# Patient Record
Sex: Male | Born: 1970 | Race: White | Hispanic: No | Marital: Married | Smoking: Former smoker
Health system: Southern US, Community
[De-identification: ages and names within clinical notes are randomized; demographics above are authoritative.]

## PROBLEM LIST (undated history)

## (undated) DIAGNOSIS — T7840XA Allergy, unspecified, initial encounter: Secondary | ICD-10-CM

## (undated) DIAGNOSIS — G4730 Sleep apnea, unspecified: Secondary | ICD-10-CM

## (undated) DIAGNOSIS — M795 Residual foreign body in soft tissue: Secondary | ICD-10-CM

## (undated) DIAGNOSIS — E119 Type 2 diabetes mellitus without complications: Secondary | ICD-10-CM

## (undated) HISTORY — DX: Residual foreign body in soft tissue: M79.5

## (undated) HISTORY — PX: SHOULDER SURGERY: SHX246

---

## 2013-10-21 DIAGNOSIS — M795 Residual foreign body in soft tissue: Secondary | ICD-10-CM

## 2013-10-21 HISTORY — DX: Residual foreign body in soft tissue: M79.5

## 2014-04-04 ENCOUNTER — Other Ambulatory Visit: Payer: Self-pay | Admitting: Occupational Medicine

## 2014-04-04 ENCOUNTER — Ambulatory Visit: Payer: Self-pay

## 2014-04-04 DIAGNOSIS — R52 Pain, unspecified: Secondary | ICD-10-CM

## 2014-04-05 ENCOUNTER — Other Ambulatory Visit: Payer: Self-pay | Admitting: Occupational Medicine

## 2014-04-05 ENCOUNTER — Ambulatory Visit: Payer: Self-pay

## 2014-04-05 DIAGNOSIS — R52 Pain, unspecified: Secondary | ICD-10-CM

## 2014-04-07 ENCOUNTER — Ambulatory Visit (INDEPENDENT_AMBULATORY_CARE_PROVIDER_SITE_OTHER): Payer: Worker's Compensation | Admitting: General Surgery

## 2014-04-07 ENCOUNTER — Encounter (INDEPENDENT_AMBULATORY_CARE_PROVIDER_SITE_OTHER): Payer: Self-pay | Admitting: General Surgery

## 2014-04-07 VITALS — BP 130/82 | HR 66 | Temp 97.4°F | Resp 16 | Ht 70.0 in | Wt 251.4 lb

## 2014-04-07 DIAGNOSIS — T148XXA Other injury of unspecified body region, initial encounter: Secondary | ICD-10-CM

## 2014-04-07 DIAGNOSIS — M795 Residual foreign body in soft tissue: Secondary | ICD-10-CM

## 2014-04-07 NOTE — Progress Notes (Signed)
Patient ID: Victor Wiley, male   DOB: [birth year], 43 y.o.   MRN: [id number]  No chief complaint on file.   HPI Victor Wiley is a 43 y.o. male.  The patient is a 43 year old male who is referred by Dr. Halford ChessmanM. Hunt secondary to a retained metal foreign body. The patient states he was having some equipment with a metal hammer and chisel when a chip falloff and into the subcutaneous tissue his abdomen the area began bleeding hematoma. The patient was seen [facility] and underwent a x-ray which revealed a metal foreign body. Patient tetanus shot was up-to-date at that time. The patient in today with tenderness to the area of the foreign body. He was started on antibiotics.  HPI  Past Medical History  Diagnosis Date  . Residual foreign body in soft tissue 2015    Past Surgical History  Procedure Laterality Date  . Shoulder surgery  207    No family history on file.  Social History History  Substance Use Topics  . Smoking status: Former Smoker    Quit date: 04/07/2012  . Smokeless tobacco: Not on file  . Alcohol Use: No    No Known Allergies  No current outpatient prescriptions on file.   No current facility-administered medications for this visit.    Review of Systems Review of Systems  Constitutional: Negative.   HENT: Negative.   Eyes: Negative.   Respiratory: Negative.   Cardiovascular: Negative.   Gastrointestinal: Negative.   Endocrine: Negative.   Neurological: Negative.     Blood pressure 130/82, pulse 66, temperature 97.4 F (36.3 C), temperature source Temporal, resp. rate 16, height 5\' 10"  (1.778 m), weight 251 lb 6.4 oz (114.034 kg).  Physical Exam Physical Exam  Constitutional: He is oriented to person, place, and time. He appears well-developed and well-nourished.  HENT:  Head: Normocephalic and atraumatic.  Eyes: Conjunctivae and EOM are normal. Pupils are equal, round, and reactive to light.  Neck: Normal range of motion. Neck supple.    Cardiovascular: Normal rate, regular rhythm and normal heart sounds.   Pulmonary/Chest: Effort normal and breath sounds normal.  Abdominal: Soft. Bowel sounds are normal. He exhibits no distension and no mass. There is no tenderness. There is no rebound and no guarding.    Hematoma Small pin point puncture site   Musculoskeletal: Normal range of motion.  Neurological: He is alert and oriented to person, place, and time.  Skin: Skin is warm and dry.    Data Reviewed KUB reveals a metal foreign body approximately 4 mm in size  Assessment    40 ohm out the retained metal foreign body to the subcutaneous tissues abdomen     Plan    1. Discussed with the patient he wanted to proceed with local anesthetic and excision at this time I proceeded with 1% lidocaine local anesthetic with field block. I made a small 1-1/2 cm incision the subcutaneous tissue and bluntly dissected down to the area where the foreign body was this was removed in entirety. I packed the incision site with quarter-inch Nu Gauze.The patient tolerated the procedure well. The specimen was given to the patient. 2. Occupational prescription for tramadol to help with pain. 3. I discussed the patient to remove the iodoform packing wash the wound in the morning, and covered with 4 x 4's.       Marigene Ehlersamirez Jr., Victor Wiley 04/07/2014, 9:43 AM

## 2014-04-21 ENCOUNTER — Encounter (INDEPENDENT_AMBULATORY_CARE_PROVIDER_SITE_OTHER): Payer: Self-pay | Admitting: General Surgery

## 2014-04-21 ENCOUNTER — Ambulatory Visit (INDEPENDENT_AMBULATORY_CARE_PROVIDER_SITE_OTHER): Payer: Worker's Compensation | Admitting: General Surgery

## 2014-04-21 VITALS — BP 118/75 | HR 77 | Temp 98.2°F | Resp 16 | Ht 70.0 in | Wt 254.0 lb

## 2014-04-21 DIAGNOSIS — T148XXA Other injury of unspecified body region, initial encounter: Secondary | ICD-10-CM

## 2014-04-21 DIAGNOSIS — W458XXA Other foreign body or object entering through skin, initial encounter: Secondary | ICD-10-CM

## 2014-04-21 NOTE — Progress Notes (Signed)
Patient ID: Victor Wiley, male   DOB: 07/13/1971, 43 y.o.   MRN: [id number] Post op course The patient is a 43 year old male status post anterior abdominal wall foreign body removal. The patient has been doing well since his last clinic visit. He's had no complaints.  On Exam: His wound is clean dry and intact, there is a scab in place there is no surrounding erythema  or  Assessment and Plan 43 year old male status post foreign body of abdominal wall 1. Patient follow up as needed   Axel FillerArmando Benjamin Casanas, MD [facility] General & [facility]ally Invasive Surgery Trauma & Emergency Surgery

## 2015-04-14 IMAGING — CR DG ABDOMEN 1V
1 series · 1 of 1 positions shown · non-contrast
Comparison: None.

CLINICAL DATA: 42-year-old male with traumatic injury with metal
clip to the patient's abdomen.

EXAM:
ABDOMEN - 1 VIEW

[view not recorded]
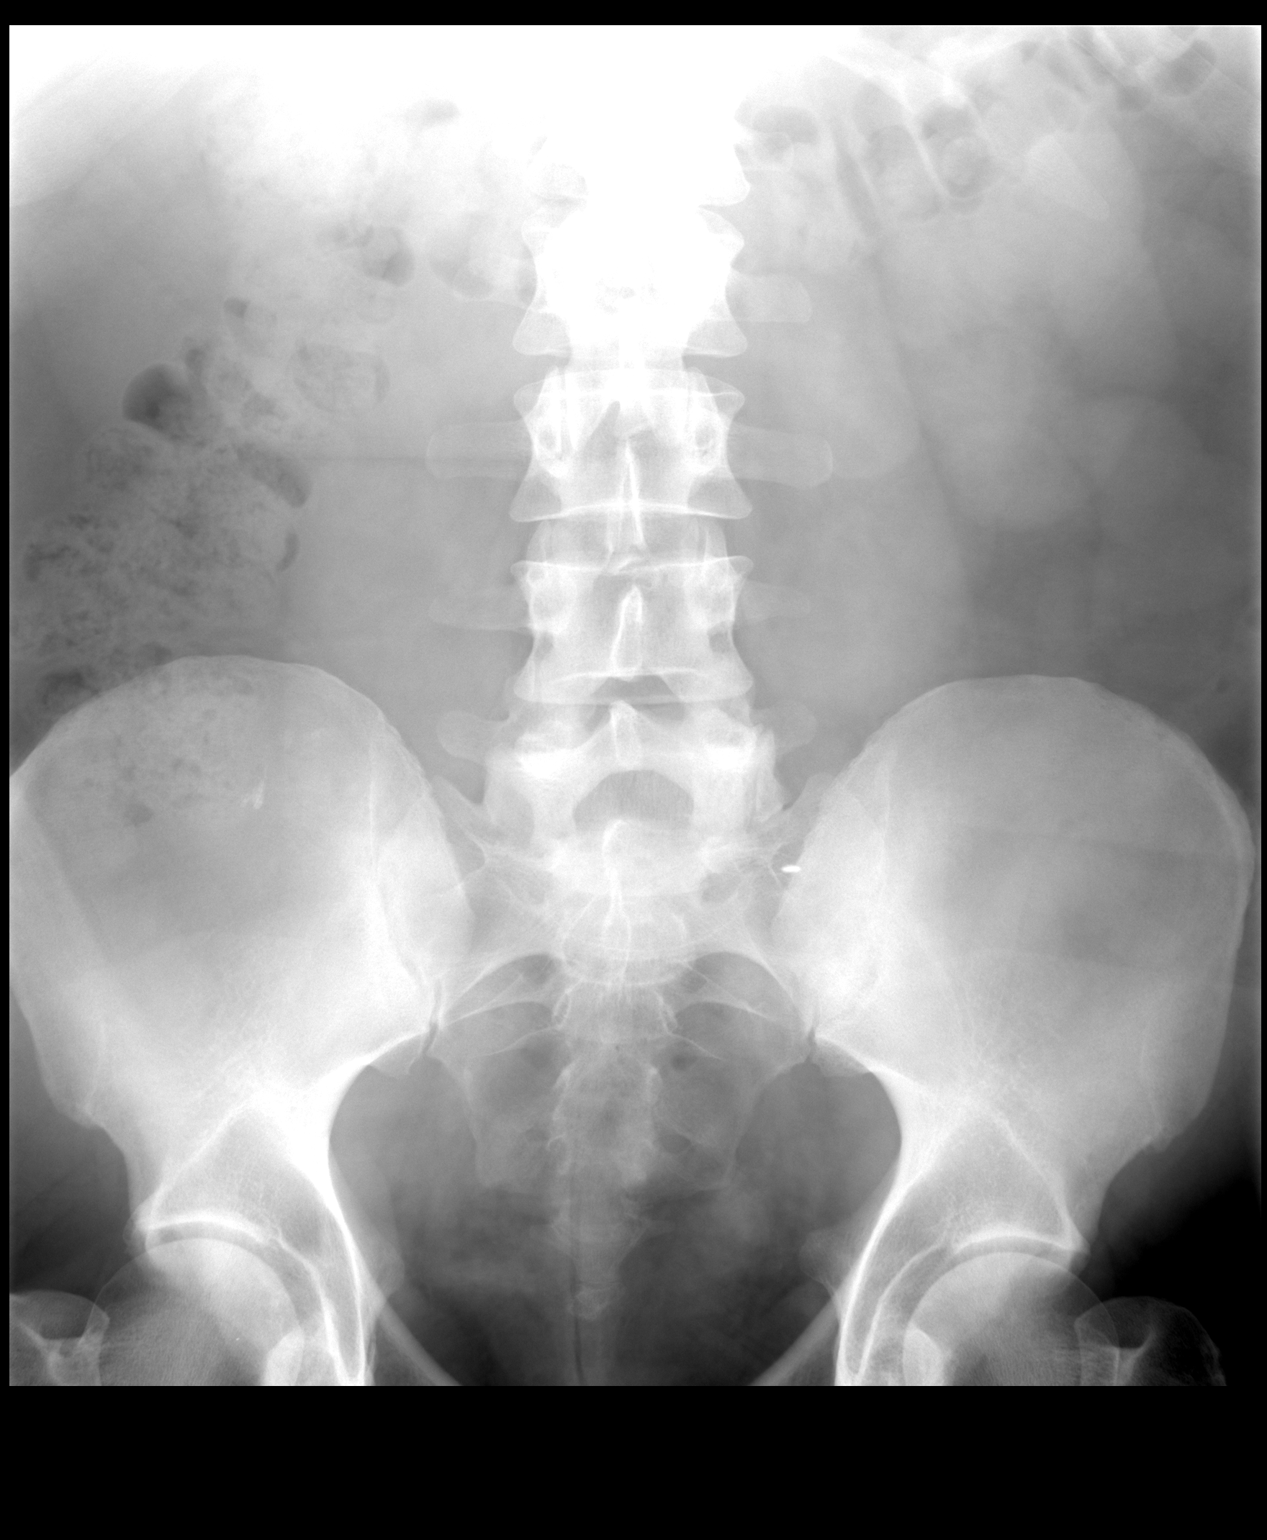

[1 of 1 positions shown; findings below may reference images not displayed]

FINDINGS: A 4 mm linear metallic structure is identified overlying the left
paramedian lower abdomen/ upper pelvis at the level of the upper
sacrum on this radiograph.

The bowel gas pattern is unremarkable.

The bony structures are unremarkable.
IMPRESSION: 4 mm linear metallic structure overlying the left paramedian lower
abdomen/ upper pelvis which probably represents a residual foreign
body.

## 2015-04-15 IMAGING — CR DG LUMBAR SPINE 2-3V
3 series · 3 of 3 positions shown · non-contrast
Comparison: One view abdomen 04/04/2014.

CLINICAL DATA: Possible metallic foreign body on one view abdomen.

EXAM:
LUMBAR SPINE - 2-3 VIEW

[view not recorded (1 of 3)]
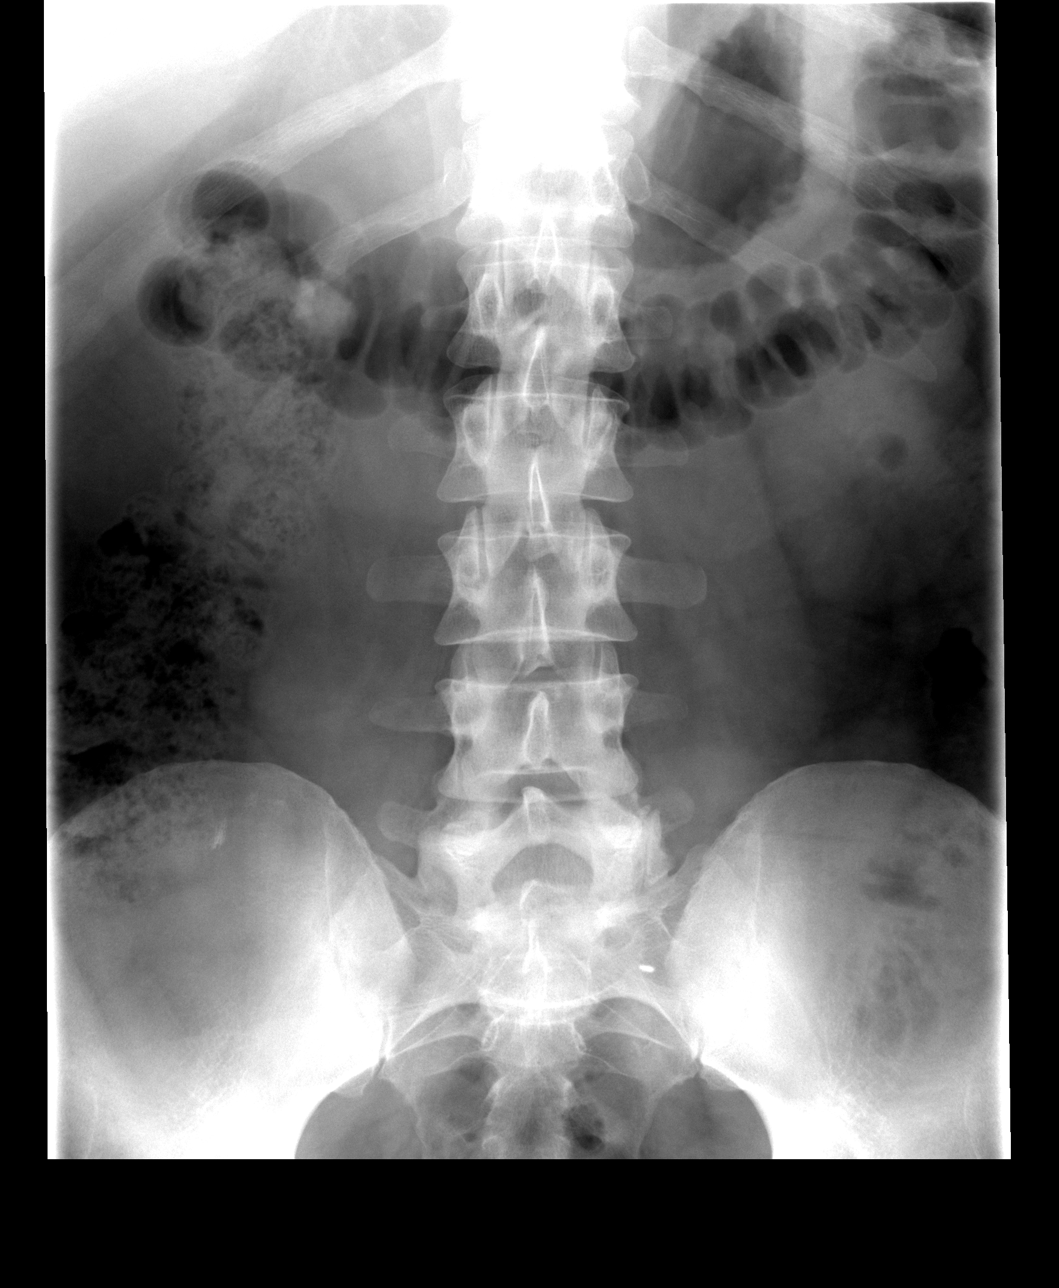

[view not recorded (2 of 3)]
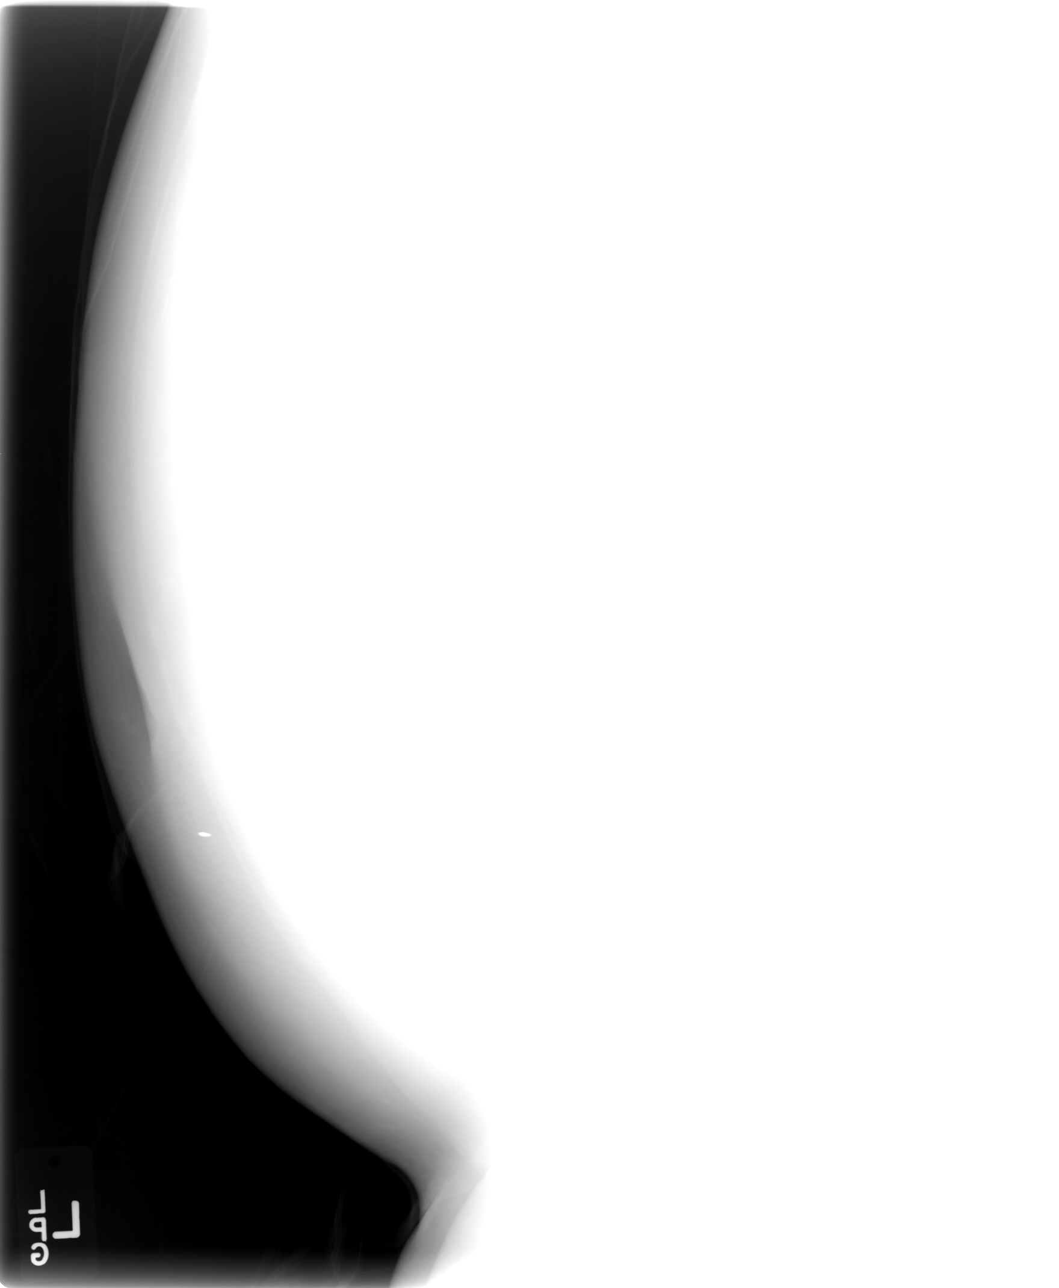

[view not recorded (3 of 3)]
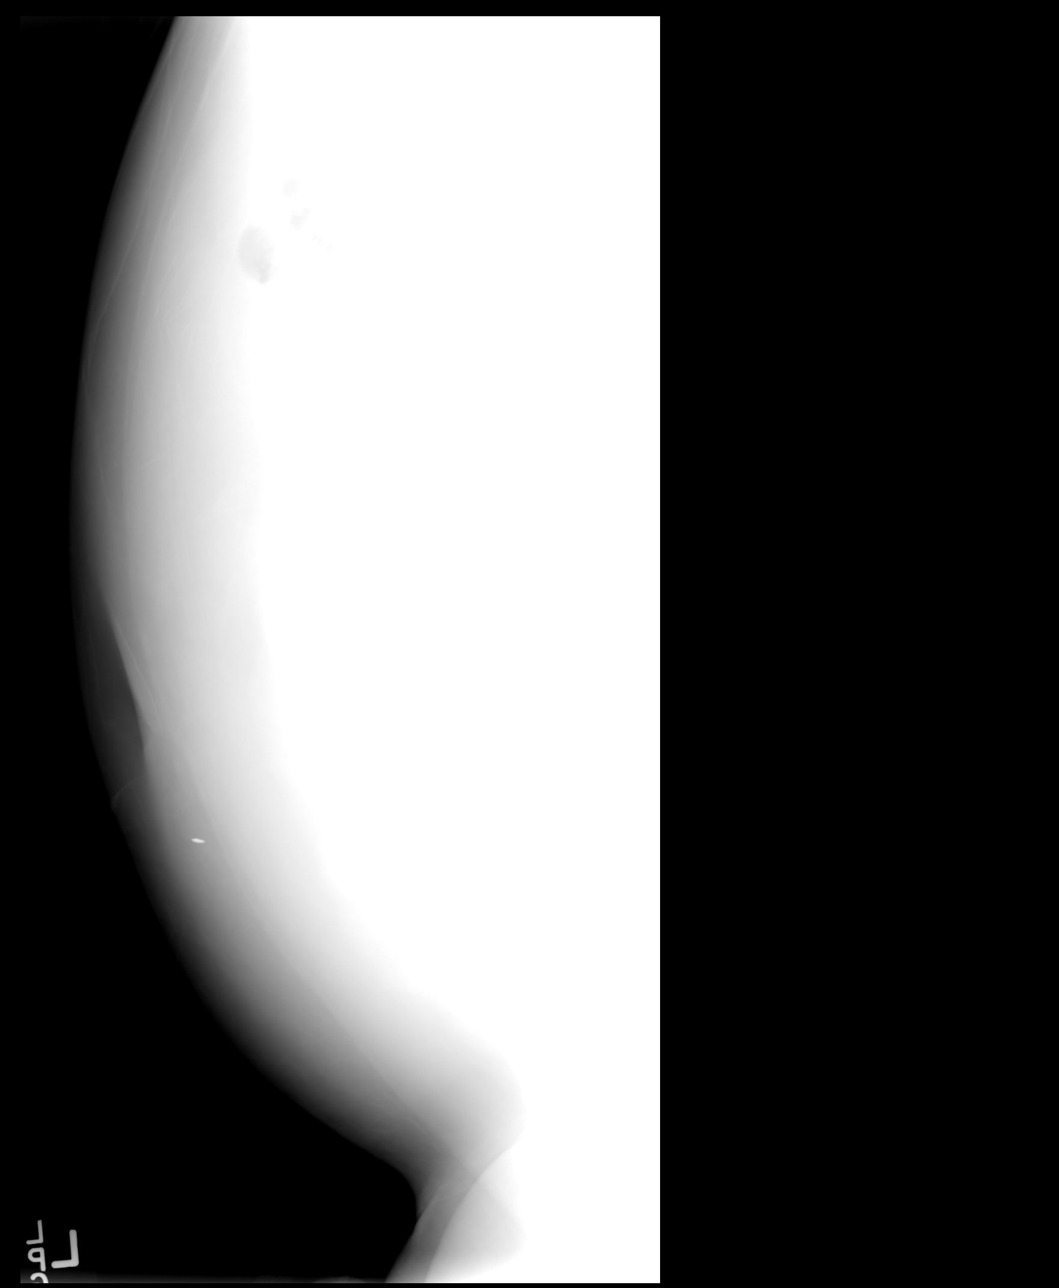

[3 of 3 positions shown; findings below may reference images not displayed]

FINDINGS: Views obtained today include an AP view of the abdomen and lateral
views concentrating on the anterior abdomen. The metallic foreign
body noted on yesterday's radiographs is unchanged in position. This
is anteriorly situated within the subcutaneous fat on the lateral
view. The bowel gas pattern appears normal. There is a mild convex
right lumbar scoliosis. There are probable surgical clips at the
cecal tip suggesting prior appendectomy
IMPRESSION: The metallic foreign body is situated within the subcutaneous fat of
the anterior abdominal wall and is unchanged in position since
yesterday's radiographs.

## 2020-01-07 ENCOUNTER — Ambulatory Visit: Payer: BC Managed Care – PPO | Attending: Internal Medicine

## 2020-01-07 DIAGNOSIS — Z23 Encounter for immunization: Secondary | ICD-10-CM

## 2020-01-07 NOTE — Progress Notes (Signed)
   Covid-19 Vaccination Clinic  Name:  Victor Wiley    MRN: [id number] DOB: 1971-01-31  01/07/2020  [name] was observed post Covid-19 immunization for 15 minutes without incident. He was provided with Vaccine Information Sheet and instruction to access the V-Safe system.   [name] was instructed to call 911 with any severe reactions post vaccine: [name] Kitchen Difficulty breathing  . Swelling of face and throat  . A fast heartbeat  . A bad rash all over body  . Dizziness and weakness   Immunizations Administered    Name Date Dose VIS Date Route   Pfizer COVID-19 Vaccine 01/07/2020 10:16 AM 0.3 mL 10/01/2019 Intramuscular   Manufacturer: [employer], [employer]   Lot: [id number]   NDC: [address]-6

## 2020-02-01 ENCOUNTER — Ambulatory Visit: Payer: Self-pay

## 2020-02-02 ENCOUNTER — Ambulatory Visit: Payer: Self-pay

## 2020-02-09 ENCOUNTER — Ambulatory Visit: Payer: BC Managed Care – PPO | Attending: Internal Medicine

## 2020-02-09 DIAGNOSIS — Z23 Encounter for immunization: Secondary | ICD-10-CM

## 2020-02-09 NOTE — Progress Notes (Addendum)
   Covid-19 Vaccination Clinic  Name:  Victor Wiley    MRN: [id number] DOB: 1971/01/29  02/09/2020  [name] was observed post Covid-19 immunization for 30 mins without incident. He was provided with Vaccine Information Sheet and instruction to access the V-Safe system.   [name] was instructed to call 911 with any severe reactions post vaccine: [name] Kitchen Difficulty breathing  . Swelling of face and throat  . A fast heartbeat  . A bad rash all over body  . Dizziness and weakness   Immunizations Administered    Name Date Dose VIS Date Route   Pfizer COVID-19 Vaccine 02/09/2020 12:10 PM 0.3 mL 12/15/2018 Intramuscular   Manufacturer: [employer], [employer]   Lot: [id number]   NDC: [address]-3

## 2020-11-15 ENCOUNTER — Ambulatory Visit: Payer: Self-pay | Admitting: Hospice and Palliative Medicine

## 2020-12-05 ENCOUNTER — Telehealth: Payer: Self-pay

## 2020-12-05 ENCOUNTER — Ambulatory Visit (INDEPENDENT_AMBULATORY_CARE_PROVIDER_SITE_OTHER): Payer: Managed Care, Other (non HMO) | Admitting: Hospice and Palliative Medicine

## 2020-12-05 ENCOUNTER — Encounter: Payer: Self-pay | Admitting: Hospice and Palliative Medicine

## 2020-12-05 ENCOUNTER — Other Ambulatory Visit: Payer: Self-pay

## 2020-12-05 VITALS — BP 136/92 | HR 94 | Temp 97.5°F | Resp 16 | Ht 71.0 in | Wt 287.4 lb

## 2020-12-05 DIAGNOSIS — R0683 Snoring: Secondary | ICD-10-CM | POA: Diagnosis not present

## 2020-12-05 DIAGNOSIS — F17290 Nicotine dependence, other tobacco product, uncomplicated: Secondary | ICD-10-CM

## 2020-12-05 DIAGNOSIS — G479 Sleep disorder, unspecified: Secondary | ICD-10-CM

## 2020-12-05 NOTE — Telephone Encounter (Signed)
Gave FG orders for sleep study. Victor Wiley 

## 2020-12-05 NOTE — Progress Notes (Signed)
[facility] [address] [place], [place] [address]  Pulmonary Sleep Medicine   Office Visit Note  Patient Name: Victor Wiley DOB: 05/21/1971 MRN [id number]  Date of Service: 12/09/2020  Complaints/HPI: Patient is here to establish care as pulmonary patient Accompanied by his wife today who is also seen by [place] for pulmonary medicine She requested that he be seen for possible OSA They both report that he snored loudly for many years--they have been unable to sleep together for many years due to issues with snoring She has also captured periods of apnea during his sleep on recording He explains he is unaware of his snoring of apnea but feels fatigued and tired all day--has brian fog when waking up and typically wakes up with headaches Previous smoker, currently vaping with nicotine Denies shortness of breath, coughing or allergy complications  ROS  General: (-) fever, (-) chills, (-) night sweats, (-) weakness Skin: (-) rashes, (-) itching,. Eyes: (-) visual changes, (-) redness, (-) itching. Nose and Sinuses: (-) nasal stuffiness or itchiness, (-) postnasal drip, (-) nosebleeds, (-) sinus trouble. Mouth and Throat: (-) sore throat, (-) hoarseness. Neck: (-) swollen glands, (-) enlarged thyroid, (-) neck pain. Respiratory: - cough, (-) bloody sputum, - shortness of breath, - wheezing. Cardiovascular: - ankle swelling, (-) chest pain. Lymphatic: (-) lymph node enlargement. Neurologic: (-) numbness, (-) tingling. Psychiatric: (-) anxiety, (-) depression   Current Medication: No outpatient encounter medications on file as of 12/05/2020.   No facility-administered encounter medications on file as of 12/05/2020.    Surgical History: Past Surgical History:  Procedure Laterality Date  . SHOULDER SURGERY  207    Medical History: Past Medical History:  Diagnosis Date  . Residual foreign body in soft tissue 2015    Family History: Family History  Problem  Relation Age of Onset  . Alzheimer's disease Maternal Grandmother     Social History: Social History   Socioeconomic History  . Marital status: Married    Spouse name: Not on file  . Number of children: Not on file  . Years of education: Not on file  . Highest education level: Not on file  Occupational History  . Not on file  Tobacco Use  . Smoking status: Former Smoker    Quit date: 04/07/2012    Years since quitting: 8.6  . Smokeless tobacco: Never Used  Vaping Use  . Vaping Use: Every day  Substance and Sexual Activity  . Alcohol use: No  . Drug use: No  . Sexual activity: Not on file  Other Topics Concern  . Not on file  Social History Narrative  . Not on file   Social Determinants of Health   Financial Resource Strain: Not on file  Food Insecurity: Not on file  Transportation Needs: Not on file  Physical Activity: Not on file  Stress: Not on file  Social Connections: Not on file  Intimate Partner Violence: Not on file    Vital Signs: Blood pressure (!) 136/92, pulse 94, temperature (!) 97.5 F (36.4 C), resp. rate 16, height 5\' 11"  (1.803 m), weight 287 lb 6.4 oz (130.4 kg), SpO2 97 %.  Examination: General Appearance: The patient is well-developed, well-nourished, and in no distress. Skin: Gross inspection of skin unremarkable. Head: normocephalic, no gross deformities. Eyes: no gross deformities noted. ENT: ears appear grossly normal no exudates. Neck: Supple. No thyromegaly. No LAD. Respiratory: Clear throughout, no rhonchi, wheeze or rales noted. Cardiovascular: Normal S1 and S2 without murmur or  rub. Extremities: No cyanosis. pulses are equal. Neurologic: Alert and oriented. No involuntary movements.  LABS: No results found for this or any previous visit (from the past 2160 hour(s)).  Radiology: Patient was never admitted.  No results found.  No results found.  EPWORTH SLEEPINESS SCALE:  Scale:  (0)= no chance of dozing; (1)= slight  chance of dozing; (2)= moderate chance of dozing; (3)= high chance of dozing  Chance  Situtation    Sitting and reading: 2    Watching TV: 2    Sitting Inactive in public: 0    As a passenger in car: 0      Lying down to rest: 3    Sitting and talking: 0    Sitting quielty after lunch: 2    In a car, stopped in traffic: 0   TOTAL SCORE:   9 out of 24  Assessment and Plan: Patient Active Problem List   Diagnosis Date Noted  . Sleep disturbance 12/09/2020  . Loud snoring 12/09/2020  . Nicotine dependence 12/09/2020   1. Sleep disturbance Risks include obesity, snoring, witness apnea periods - PSG Sleep Study; Future  2. Loud snoring Years of snoring, reviewed recordings from wife with witnessed prolonged apnea during his sleep  3. Other tobacco product nicotine dependence, uncomplicated Currently vaping--previous cigarette smoker Smoking cessation counseling: 1. Pt acknowledges the risks of long term smoking, she will try to quite smoking. 2. Options for different medications including nicotine products, chewing gum, patch etc, Wellbutrin and Chantix is discussed 3. Goal and date of compete cessation is discussed 4. Total time spent in smoking cessation is 15 min.   General Counseling: I have discussed the findings of the evaluation and examination with Feliz Beam.  I have also discussed any further diagnostic evaluation thatmay be needed or ordered today. Zaden verbalizes understanding of the findings of todays visit. We also reviewed his medications today and discussed drug interactions and side effects including but not limited excessive drowsiness and altered mental states. We also discussed that there is always a risk not just to him but also people around him. he has been encouraged to call the office with any questions or concerns that should arise related to todays visit.  Orders Placed This Encounter  Procedures  . PSG Sleep Study    Standing Status:   Future     Standing Expiration Date:   12/05/2021    Order Specific Question:   Where should this test be performed:    Answer:   [facility]     Time spent: 57  I have personally obtained a history, examined the patient, evaluated laboratory and imaging results, formulated the assessment and plan and placed orders. This patient was seen by Brent General AGNP-C in Collaboration with Dr. Freda Munro as a part of collaborative care agreement.    Yevonne Pax, MD [facility] Pulmonary and Critical Care Sleep medicine

## 2020-12-09 ENCOUNTER — Encounter: Payer: Self-pay | Admitting: Hospice and Palliative Medicine

## 2020-12-09 DIAGNOSIS — G479 Sleep disorder, unspecified: Secondary | ICD-10-CM | POA: Insufficient documentation

## 2020-12-09 DIAGNOSIS — F17200 Nicotine dependence, unspecified, uncomplicated: Secondary | ICD-10-CM | POA: Insufficient documentation

## 2020-12-09 DIAGNOSIS — R0683 Snoring: Secondary | ICD-10-CM | POA: Insufficient documentation

## 2020-12-27 ENCOUNTER — Telehealth: Payer: Self-pay

## 2020-12-27 NOTE — Telephone Encounter (Signed)
Pt cancelled sleep study, called to f/u with pt about a f/u appt, asked on VM to call back JS

## 2021-01-15 ENCOUNTER — Ambulatory Visit: Payer: Managed Care, Other (non HMO) | Admitting: Hospice and Palliative Medicine

## 2024-05-03 ENCOUNTER — Ambulatory Visit: Admitting: Nurse Practitioner

## 2024-05-17 ENCOUNTER — Encounter: Payer: Self-pay | Admitting: Dietician

## 2024-05-17 ENCOUNTER — Encounter: Attending: Nurse Practitioner | Admitting: Dietician

## 2024-05-17 VITALS — Ht 71.0 in | Wt 289.8 lb

## 2024-05-17 DIAGNOSIS — E119 Type 2 diabetes mellitus without complications: Secondary | ICD-10-CM | POA: Diagnosis present

## 2024-05-17 DIAGNOSIS — Z713 Dietary counseling and surveillance: Secondary | ICD-10-CM | POA: Diagnosis not present

## 2024-05-17 NOTE — Patient Instructions (Signed)
 Continue with healthy food choices, and control portions of starchy foods. Keep to 1 cup or less on average. Keep up regular activity, great job! Consider testing blood sugar fasting in the mornings, and again 2 hours after a meal. If numbers are mostly in the goal ranges, it can be ok test every other day. Discuss with medical provider. Call with any questions or to schedule another appointment.

## 2024-05-17 NOTE — Progress Notes (Signed)
 Diabetes Self-Management Education  Visit Type: First/Initial  Appt. Start Time: 1630 Appt. End Time: 1735  05/17/2024  [name], identified by name and date of birth, is a 53 y.o. male with a diagnosis of Diabetes: Type 2.   ASSESSMENT  Height 5' 11 (1.803 m), weight 289 lb 12.8 oz (131.5 kg). Body mass index is 40.42 kg/m.   Diabetes Self-Management Education - 05/17/24 1640       Visit Information   Visit Type First/Initial      Initial Visit   Diabetes Type Type 2    Date Diagnosed 03/2024    Are you taking your medications as prescribed? Yes      Health Coping   How would you rate your overall health? Good      Psychosocial Assessment   Patient Belief/Attitude about Diabetes Motivated to manage diabetes    Learning Readiness Change in progress    How often do you need to have someone help you when you read instructions, pamphlets, or other written materials from your doctor or pharmacy? 1 - Never    What is the last grade level you completed in school? assoc degree      Pre-Education Assessment   Patient understands the diabetes disease and treatment process. Needs Instruction    Patient understands incorporating nutritional management into lifestyle. Needs Review    Patient undertands incorporating physical activity into lifestyle. Needs Review    Patient understands using medications safely. Comprehends key points    Patient understands monitoring blood glucose, interpreting and using results Comprehends key points    Patient understands prevention, detection, and treatment of acute complications. Needs Instruction    Patient understands prevention, detection, and treatment of chronic complications. Needs Instruction    Patient understands how to develop strategies to address psychosocial issues. Needs Instruction    Patient understands how to develop strategies to promote health/change behavior. Comprehends key points      Complications   Last HgB A1C  per patient/outside source 9.2 %   03/2024   How often do you check your blood sugar? 1-2 times/day    Fasting Blood glucose range (mg/dL) --   no fasting tests at this time   Postprandial Blood glucose range (mg/dL) 29-870;869-820   average around 118 per patient   Number of hypoglycemic episodes per month 0    Have you had a dilated eye exam in the past 12 months? Yes    Have you had a dental exam in the past 12 months? No    Are you checking your feet? Yes    How many days per week are you checking your feet? 1      Dietary Intake   Breakfast atkins chocolate drink, low carb    Lunch meat loaf, no sides; chicken salad no bread    Dinner grilled chicken/ occ with mashed potatoes/ mostly low carb sides  ie veg    Beverage(s) water; 1 mello yello daily/ diet sunkist      Activity / Exercise   Activity / Exercise Type Light (walking / raking leaves)   active job with walking, lifting   How many days per week do you exercise? 5    How many minutes per day do you exercise? 30    Total minutes per week of exercise 150      Patient Education   Previous Diabetes Education No    Disease Pathophysiology Definition of diabetes, type 1 and 2, and the diagnosis of diabetes;Factors  that contribute to the development of diabetes;Explored patient's options for treatment of their diabetes    Healthy Eating Role of diet in the treatment of diabetes and the relationship between the three main macronutrients and blood glucose level;Plate Method;Reviewed blood glucose goals for pre and post meals and how to evaluate the patients' food intake on their blood glucose level.;Meal timing in regards to the patients' current diabetes medication.;Meal options for control of blood glucose level and chronic complications.;Other (comment)   portion sizes using hand comparisons, food models   Being Active Role of exercise on diabetes management, blood pressure control and cardiac health.    Monitoring Purpose and  frequency of SMBG.;Taught/discussed recording of test results and interpretation of SMBG.;Interpreting lab values - A1C, lipid, urine microalbumina.;Identified appropriate SMBG and/or A1C goals.    Acute complications Taught prevention, symptoms, and  treatment of hypoglycemia - the 15 rule.;Discussed and identified patients' prevention, symptoms, and treatment of hyperglycemia.    Chronic complications Relationship between chronic complications and blood glucose control    Diabetes Stress and Support Role of stress on diabetes      Individualized Goals (developed by patient)   Nutrition General guidelines for healthy choices and portions discussed    Monitoring  Test my blood glucose as discussed      Outcomes   Expected Outcomes Demonstrated interest in learning. Expect positive outcomes    Future DMSE 2 months          Individualized Plan for Diabetes Self-Management Training:   Learning Objective:  Patient will have a greater understanding of diabetes self-management. Patient education plan is to attend individual and/or group sessions per assessed needs and concerns.   Plan:   Patient Instructions  Continue with healthy food choices, and control portions of starchy foods. Keep to 1 cup or less on average. Keep up regular activity, great job! Consider testing blood sugar fasting in the mornings, and again 2 hours after a meal. If numbers are mostly in the goal ranges, it can be ok test every other day. Discuss with medical provider. Call with any questions or to schedule another appointment.  Expected Outcomes:  Demonstrated interest in learning. Expect positive outcomes  Education material provided: ADA - How to Thrive: A Guide for Your Journey with Diabetes; General meal planning Guidelines for Diabetes; plate planner with food lists; blood glucose record   If problems or questions, patient to contact team via:  Phone  Future DSME appointment: 2 months --to schedule after  next medical visit

## 2024-06-28 ENCOUNTER — Encounter: Payer: Self-pay | Admitting: Gastroenterology

## 2024-07-15 NOTE — H&P (Signed)
 Pre-Procedure H&P   Patient ID: Victor Wiley is a 53 y.o. male.  Gastroenterology Provider: [name], DO  Referring Provider: Dr. Bertrum PCP: [facility]  Date: 07/16/2024  HPI [name] is a 53 y.o. male who presents today for Colonoscopy for Colorectal cancer screening .  Initial screening colonoscopy.  No family history of colon cancer or colon polyps.  Twice a day bowel movement without melena or hematochezia. Hemoglobin 15.4 MCV 89 platelets 272,000 creatinine 0.9   Past Medical History:  Diagnosis Date   Allergy    Diabetes mellitus without complication (HCC)    Residual foreign body in soft tissue 10/21/2013   Sleep apnea     Past Surgical History:  Procedure Laterality Date   SHOULDER SURGERY  207    Family History No h/o GI disease or malignancy  Review of Systems  Constitutional:  Negative for activity change, appetite change, chills, diaphoresis, fatigue, fever and unexpected weight change.  HENT:  Negative for trouble swallowing and voice change.   Respiratory:  Negative for shortness of breath and wheezing.   Cardiovascular:  Negative for chest pain, palpitations and leg swelling.  Gastrointestinal:  Negative for abdominal distention, abdominal pain, anal bleeding, blood in stool, constipation, diarrhea, nausea and vomiting.  Musculoskeletal:  Negative for arthralgias and myalgias.  Skin:  Negative for color change and pallor.  Neurological:  Negative for dizziness, syncope and weakness.  Psychiatric/Behavioral:  Negative for confusion. The patient is not nervous/anxious.   All other systems reviewed and are negative.    Medications No current facility-administered medications on file prior to encounter.   Current Outpatient Medications on File Prior to Encounter  Medication Sig Dispense Refill   atorvastatin (LIPITOR) 20 MG tablet Take 20 mg by mouth daily.     glyBURIDE (DIABETA) 2.5 MG tablet Take 2.5 mg  by mouth.     ACCU-CHEK GUIDE TEST test strip USE WITH MACHINE TO CHECK BLOOD GLUCOSE 1-2 TIMES A DAY     Accu-Chek Softclix Lancets lancets USE WITH MACHINE TO CHECK BLOOD SUGAR 1-2 TIMES A DAY     Blood Glucose Monitoring Suppl (ACCU-CHEK GUIDE ME) w/Device KIT USE TO CHECK BLOOD SUGAR 1-2 TIMES A DAY      Pertinent medications related to GI and procedure were reviewed by me with the patient prior to the procedure   Current Facility-Administered Medications:    0.9 %  sodium chloride  infusion, , Intravenous, Continuous, Jungling Elspeth Ozell, DO, Last Rate: 20 mL/hr at 07/16/24 1027, Continued from Pre-op at 07/16/24 1027  sodium chloride  20 mL/hr at 07/16/24 1027       No Known Allergies Allergies were reviewed by me prior to the procedure  Objective   Body mass index is 39.11 kg/m. Vitals:   07/16/24 1006  BP: (!) 127/98  Pulse: 84  Resp: 18  Temp: (!) 96.8 F (36 C)  TempSrc: Tympanic  SpO2: 96%  Weight: 127.2 kg  Height: 5' 11 (1.803 m)     Physical Exam Vitals and nursing note reviewed.  Constitutional:      General: He is not in acute distress.    Appearance: Normal appearance. He is obese. He is not ill-appearing, toxic-appearing or diaphoretic.  HENT:     Head: Normocephalic and atraumatic.     Nose: Nose normal.     Mouth/Throat:     Mouth: Mucous membranes are moist.     Pharynx: Oropharynx is clear.  Eyes:  General: No scleral icterus.    Extraocular Movements: Extraocular movements intact.  Cardiovascular:     Rate and Rhythm: Normal rate and regular rhythm.     Heart sounds: Normal heart sounds. No murmur heard.    No friction rub. No gallop.  Pulmonary:     Effort: Pulmonary effort is normal. No respiratory distress.     Breath sounds: Normal breath sounds. No wheezing, rhonchi or rales.  Abdominal:     General: Bowel sounds are normal. There is no distension.     Palpations: Abdomen is soft.     Tenderness: There is no abdominal  tenderness. There is no guarding or rebound.  Musculoskeletal:     Cervical back: Neck supple.     Right lower leg: No edema.     Left lower leg: No edema.  Skin:    General: Skin is warm and dry.     Coloration: Skin is not jaundiced or pale.  Neurological:     General: No focal deficit present.     Mental Status: He is alert and oriented to person, place, and time. Mental status is at baseline.  Psychiatric:        Mood and Affect: Mood normal.        Behavior: Behavior normal.        Thought Content: Thought content normal.        Judgment: Judgment normal.      Assessment:  [name] is a 53 y.o. male  who presents today for Colonoscopy for Colorectal cancer screening .  Plan:  Colonoscopy with possible intervention today  Colonoscopy with possible biopsy, control of bleeding, polypectomy, and interventions as necessary has been discussed with the patient/patient representative. Informed consent was obtained from the patient/patient representative after explaining the indication, nature, and risks of the procedure including but not limited to death, bleeding, perforation, missed neoplasm/lesions, cardiorespiratory compromise, and reaction to medications. Opportunity for questions was given and appropriate answers were provided. Patient/patient representative has verbalized understanding is amenable to undergoing the procedure.   [name], DO  [facility]  Portions of the record may have been created with voice recognition software. Occasional wrong-word or 'sound-a-like' substitutions may have occurred due to the inherent limitations of voice recognition software.  Read the chart carefully and recognize, using context, where substitutions may have occurred.

## 2024-07-16 ENCOUNTER — Ambulatory Visit
Admission: RE | Admit: 2024-07-16 | Discharge: 2024-07-16 | Disposition: A | Discharge status: Home / Self Care | Attending: Gastroenterology | Admitting: Gastroenterology

## 2024-07-16 ENCOUNTER — Ambulatory Visit: Admitting: Certified Registered Nurse Anesthetist

## 2024-07-16 ENCOUNTER — Other Ambulatory Visit: Payer: Self-pay

## 2024-07-16 ENCOUNTER — Encounter
Admission: RE | Disposition: A | Discharge status: Home / Self Care | Payer: Self-pay | Source: Home / Self Care | Attending: Gastroenterology

## 2024-07-16 ENCOUNTER — Encounter: Payer: Self-pay | Admitting: Gastroenterology

## 2024-07-16 DIAGNOSIS — Z1211 Encounter for screening for malignant neoplasm of colon: Secondary | ICD-10-CM | POA: Diagnosis present

## 2024-07-16 DIAGNOSIS — G4730 Sleep apnea, unspecified: Secondary | ICD-10-CM | POA: Insufficient documentation

## 2024-07-16 DIAGNOSIS — J449 Chronic obstructive pulmonary disease, unspecified: Secondary | ICD-10-CM | POA: Diagnosis not present

## 2024-07-16 DIAGNOSIS — E119 Type 2 diabetes mellitus without complications: Secondary | ICD-10-CM | POA: Insufficient documentation

## 2024-07-16 DIAGNOSIS — Z87891 Personal history of nicotine dependence: Secondary | ICD-10-CM | POA: Insufficient documentation

## 2024-07-16 DIAGNOSIS — Z6839 Body mass index (BMI) 39.0-39.9, adult: Secondary | ICD-10-CM | POA: Diagnosis not present

## 2024-07-16 DIAGNOSIS — E6689 Other obesity not elsewhere classified: Secondary | ICD-10-CM | POA: Insufficient documentation

## 2024-07-16 DIAGNOSIS — Z7984 Long term (current) use of oral hypoglycemic drugs: Secondary | ICD-10-CM | POA: Diagnosis not present

## 2024-07-16 HISTORY — PX: COLONOSCOPY: SHX5424

## 2024-07-16 HISTORY — DX: Type 2 diabetes mellitus without complications: E11.9

## 2024-07-16 HISTORY — DX: Sleep apnea, unspecified: G47.30

## 2024-07-16 HISTORY — DX: Allergy, unspecified, initial encounter: T78.40XA

## 2024-07-16 LAB — GLUCOSE, CAPILLARY: Glucose-Capillary: 94 mg/dL (ref 70–99)

## 2024-07-16 SURGERY — COLONOSCOPY
Anesthesia: General

## 2024-07-16 MED ORDER — PROPOFOL 500 MG/50ML IV EMUL
INTRAVENOUS | Status: DC | PRN
  Administered 2024-07-16: 160 ug/kg/min via INTRAVENOUS

## 2024-07-16 MED ORDER — PROPOFOL 10 MG/ML IV BOLUS
INTRAVENOUS | Status: DC | PRN
  Administered 2024-07-16: 80 mg via INTRAVENOUS

## 2024-07-16 MED ORDER — SODIUM CHLORIDE 0.9 % IV SOLN: INTRAVENOUS | Status: DC

## 2024-07-16 NOTE — Anesthesia Preprocedure Evaluation (Signed)
 Anesthesia Evaluation  Patient identified by MRN, date of birth, ID band Patient awake    Reviewed: Allergy & Precautions, NPO status , Patient's Chart, lab work & pertinent test results  Airway Mallampati: III  TM Distance: >3 FB Neck ROM: full    Dental  (+) Teeth Intact, Chipped,    Pulmonary neg pulmonary ROS, sleep apnea , COPD, former smoker   Pulmonary exam normal  + decreased breath sounds      Cardiovascular Exercise Tolerance: Good negative cardio ROS Normal cardiovascular exam Rhythm:Regular Rate:Normal     Neuro/Psych negative neurological ROS  negative psych ROS   GI/Hepatic negative GI ROS, Neg liver ROS,,,  Endo/Other  negative endocrine ROSdiabetes, Type 2, Oral Hypoglycemic Agents  Class 4 obesity  Renal/GU negative Renal ROS  negative genitourinary   Musculoskeletal   Abdominal  (+) + obese  Peds negative pediatric ROS (+)  Hematology negative hematology ROS (+)   Anesthesia Other Findings Past Medical History: No date: Allergy No date: Diabetes mellitus without complication (HCC) 10/21/2013: Residual foreign body in soft tissue No date: Sleep apnea  Past Surgical History: 207: SHOULDER SURGERY     Reproductive/Obstetrics negative OB ROS                              Anesthesia Physical Anesthesia Plan  ASA: 3  Anesthesia Plan: General   Post-op Pain Management:    Induction: Intravenous  PONV Risk Score and Plan: Propofol  infusion and TIVA  Airway Management Planned: Natural Airway and Nasal Cannula  Additional Equipment:   Intra-op Plan:   Post-operative Plan:   Informed Consent: I have reviewed the patients History and Physical, chart, labs and discussed the procedure including the risks, benefits and alternatives for the proposed anesthesia with the patient or authorized representative who has indicated his/her understanding and acceptance.      Dental Advisory Given  Plan Discussed with: CRNA  Anesthesia Plan Comments:         Anesthesia Quick Evaluation

## 2024-07-16 NOTE — Interval H&P Note (Signed)
 History and Physical Interval Note: Preprocedure H&P from 07/16/24  was reviewed and there was no interval change after seeing and examining the patient.  Written consent was obtained from the patient after discussion of risks, benefits, and alternatives. Patient has consented to proceed with Colonoscopy with possible intervention   07/16/2024 10:31 AM  Victor Wiley  has presented today for surgery, with the diagnosis of Screening for colon cancer (Z12.11.  The various methods of treatment have been discussed with the patient and family. After consideration of risks, benefits and other options for treatment, the patient has consented to  Procedure(s): COLONOSCOPY (N/A) as a surgical intervention.  The patient's history has been reviewed, patient examined, no change in status, stable for surgery.  I have reviewed the patient's chart and labs.  Questions were answered to the patient's satisfaction.     [name]

## 2024-07-16 NOTE — Anesthesia Procedure Notes (Signed)
 Date/Time: 07/16/2024 10:37 AM  Performed by: Duwayne Craven, CRNAPre-anesthesia Checklist: Patient identified, Emergency Drugs available, Suction available, Patient being monitored and Timeout performed Patient Re-evaluated:Patient Re-evaluated prior to induction Oxygen Delivery Method: Nasal cannula Induction Type: IV induction Placement Confirmation: CO2 detector and positive ETCO2

## 2024-07-16 NOTE — Op Note (Signed)
 [facility] Patient Name: Victor Wiley Procedure Date: 07/16/2024 10:27 AM MRN: [id number] Account #: [id number] Date of Birth: 1971-02-18 Admit Type: Outpatient Age: 53 Room: [facility] ENDO ROOM 1 Gender: Male Note Status: Finalized Instrument Name: Colon Scope [id number] Procedure:             Colonoscopy Indications:           Screening for colorectal malignant neoplasm Providers:             Victor Ozell Jungling DO, DO Referring MD:          Victor Wiley. Bertrum, MD (Referring MD) Medicines:             Monitored Anesthesia Care Complications:         No immediate complications. Estimated blood loss: None. Procedure:             Pre-Anesthesia Assessment:                        - Prior to the procedure, a History and Physical was                         performed, and patient medications and allergies were                         reviewed. The patient is competent. The risks and                         benefits of the procedure and the sedation options and                         risks were discussed with the patient. All questions                         were answered and informed consent was obtained.                         Patient identification and proposed procedure were                         verified by the physician, the nurse, the anesthetist                         and the technician in the endoscopy suite. Mental                         Status Examination: alert and oriented. Airway                         Examination: normal oropharyngeal airway and neck                         mobility. Respiratory Examination: clear to                         auscultation. CV Examination: RRR, no murmurs, no S3                         or S4. Prophylactic Antibiotics: The patient does not  require prophylactic antibiotics. Prior                         Anticoagulants: The patient has taken no anticoagulant                          or antiplatelet agents. ASA Grade Assessment: IV - A                         patient with severe systemic disease that is a                         constant threat to life. After reviewing the risks and                         benefits, the patient was deemed in satisfactory                         condition to undergo the procedure. The anesthesia                         plan was to use monitored anesthesia care (MAC).                         Immediately prior to administration of medications,                         the patient was re-assessed for adequacy to receive                         sedatives. The heart rate, respiratory rate, oxygen                         saturations, blood pressure, adequacy of pulmonary                         ventilation, and response to care were monitored                         throughout the procedure. The physical status of the                         patient was re-assessed after the procedure.                        After obtaining informed consent, the colonoscope was                         passed under direct vision. Throughout the procedure,                         the patient's blood pressure, pulse, and oxygen                         saturations were monitored continuously. The                         Colonoscope was introduced through the anus and  advanced to the the terminal ileum, with                         identification of the appendiceal orifice and IC                         valve. The colonoscopy was performed without                         difficulty. The patient tolerated the procedure well.                         The quality of the bowel preparation was evaluated                         using the BBPS [facility] Bowel Preparation Scale) with                         scores of: Right Colon = 3, Transverse Colon = 3 and                         Left Colon = 3 (entire mucosa seen well with no                          residual staining, small fragments of stool or opaque                         liquid). The total BBPS score equals 9. The terminal                         ileum, ileocecal valve, appendiceal orifice, and                         rectum were photographed. Findings:      The perianal and digital rectal examinations were normal. Pertinent       negatives include normal sphincter tone.      The terminal ileum appeared normal. Estimated blood loss: none.      Retroflexion in the right colon was performed.      The entire examined colon appeared normal on direct and retroflexion       views. Impression:            - The examined portion of the ileum was normal.                        - The entire examined colon is normal on direct and                         retroflexion views.                        - No specimens collected. Recommendation:        - Patient has a contact number available for                         emergencies. The signs and symptoms of potential  delayed complications were discussed with the patient.                         Return to normal activities tomorrow. Written                         discharge instructions were provided to the patient.                        - Discharge patient to home.                        - Resume previous diet.                        - Continue present medications.                        - Repeat colonoscopy in 10 years for screening                         purposes.                        - Return to referring physician as previously                         scheduled.                        - The findings and recommendations were discussed with                         the patient. Procedure Code(s):     --- Professional ---                        [id number], Colonoscopy, flexible; diagnostic, including                         collection of specimen(s) by brushing or washing, when                         performed (separate  procedure) Diagnosis Code(s):     --- Professional ---                        Z12.11, Encounter for screening for malignant neoplasm                         of colon CPT copyright 2022 [employer]. All rights reserved. The codes documented in this report are preliminary and upon coder review may  be revised to meet current compliance requirements. Attending Participation:      I personally performed the entire procedure. Victor Jungling, DO Victor Ozell Jungling DO, DO 07/16/2024 11:03:12 AM This report has been signed electronically. Number of Addenda: 0 Note Initiated On: 07/16/2024 10:27 AM Scope Withdrawal Time: 0 hours 10 minutes 12 seconds  Total Procedure Duration: 0 hours 13 minutes 7 seconds  Estimated Blood Loss:  Estimated blood loss: none.      [facility]

## 2024-07-16 NOTE — Transfer of Care (Signed)
 Immediate Anesthesia Transfer of Care Note  Patient: Victor Wiley  Procedure(s) Performed: COLONOSCOPY  Patient Location: PACU  Anesthesia Type:General  Level of Consciousness: awake and alert   Airway & Oxygen Therapy: Patient Spontanous Breathing  Post-op Assessment: Report given to RN and Post -op Vital signs reviewed and stable  Post vital signs: Reviewed and stable  Last Vitals:  Vitals Value Taken Time  BP 105/72 07/16/24 11:00  Temp    Pulse 87 07/16/24 11:02  Resp 20 07/16/24 11:02  SpO2 94 % 07/16/24 11:02  Vitals shown include unfiled device data.  Last Pain:  Vitals:   07/16/24 1100  TempSrc:   PainSc: 0-No pain         Complications: No notable events documented.

## 2024-07-16 NOTE — Anesthesia Postprocedure Evaluation (Signed)
 Anesthesia Post Note  Patient: Victor Wiley  Procedure(s) Performed: COLONOSCOPY  Patient location during evaluation: PACU Anesthesia Type: General Level of consciousness: awake Pain management: satisfactory to patient Vital Signs Assessment: post-procedure vital signs reviewed and stable Respiratory status: spontaneous breathing Cardiovascular status: stable Anesthetic complications: no   No notable events documented.   Last Vitals:  Vitals:   07/16/24 1100 07/16/24 1110  BP: 105/72 106/73  Pulse: 87 78  Resp: 16 10  Temp:    SpO2: 95% 97%    Last Pain:  Vitals:   07/16/24 1110  TempSrc:   PainSc: 0-No pain                 VAN STAVEREN,Renay Crammer
